# Patient Record
Sex: Female | Born: 1985 | Race: White | Hispanic: No | Marital: Single | Smoking: Never smoker
Health system: Southern US, Community
[De-identification: ages and names within clinical notes are randomized; demographics above are authoritative.]

## PROBLEM LIST (undated history)

## (undated) DIAGNOSIS — G809 Cerebral palsy, unspecified: Secondary | ICD-10-CM

## (undated) DIAGNOSIS — R569 Unspecified convulsions: Secondary | ICD-10-CM

## (undated) HISTORY — PX: VAGUS NERVE STIMULATOR INSERTION: SHX348

---

## 2013-05-23 ENCOUNTER — Emergency Department (HOSPITAL_BASED_OUTPATIENT_CLINIC_OR_DEPARTMENT_OTHER)
Admission: EM | Admit: 2013-05-23 | Discharge: 2013-05-23 | Disposition: A | Discharge status: Home / Self Care | Payer: Medicaid Other | Attending: Emergency Medicine | Admitting: Emergency Medicine

## 2013-05-23 ENCOUNTER — Encounter (HOSPITAL_BASED_OUTPATIENT_CLINIC_OR_DEPARTMENT_OTHER): Payer: Self-pay | Admitting: Emergency Medicine

## 2013-05-23 ENCOUNTER — Emergency Department (HOSPITAL_BASED_OUTPATIENT_CLINIC_OR_DEPARTMENT_OTHER): Payer: Medicaid Other

## 2013-05-23 DIAGNOSIS — IMO0002 Reserved for concepts with insufficient information to code with codable children: Secondary | ICD-10-CM | POA: Insufficient documentation

## 2013-05-23 DIAGNOSIS — Y92009 Unspecified place in unspecified non-institutional (private) residence as the place of occurrence of the external cause: Secondary | ICD-10-CM | POA: Insufficient documentation

## 2013-05-23 DIAGNOSIS — Y939 Activity, unspecified: Secondary | ICD-10-CM | POA: Insufficient documentation

## 2013-05-23 DIAGNOSIS — G40909 Epilepsy, unspecified, not intractable, without status epilepticus: Secondary | ICD-10-CM | POA: Insufficient documentation

## 2013-05-23 DIAGNOSIS — S5001XA Contusion of right elbow, initial encounter: Secondary | ICD-10-CM

## 2013-05-23 DIAGNOSIS — R569 Unspecified convulsions: Secondary | ICD-10-CM

## 2013-05-23 DIAGNOSIS — Z8669 Personal history of other diseases of the nervous system and sense organs: Secondary | ICD-10-CM | POA: Insufficient documentation

## 2013-05-23 DIAGNOSIS — S5000XA Contusion of unspecified elbow, initial encounter: Secondary | ICD-10-CM | POA: Insufficient documentation

## 2013-05-23 HISTORY — DX: Unspecified convulsions: R56.9

## 2013-05-23 HISTORY — DX: Cerebral palsy, unspecified: G80.9

## 2013-05-23 MED ORDER — IBUPROFEN 600 MG PO TABS: 600.0000 mg | ORAL_TABLET | Freq: Four times a day (QID) | ORAL | Status: AC | PRN

## 2013-05-23 NOTE — ED Provider Notes (Signed)
This chart was scribed for [name], DO by Dorothey Basemania Sutton, ED Scribe. This patient was seen in room MH04/MH04 and the patient's care was started at 8:06 PM.  CHIEF COMPLAINT: elbow injury  HPI:  HPI Comments: Makayla Lee is a 28 y.o. female who with a history of seizures (patient takes Keppra, Trileptal, and Banzel) presents to the Emergency Department from a group home complaining of an injury to the right elbow that she sustained earlier today after her caregiver reports that the patient had a seizure, causing her to hit her right arm on a wall. Patient is complaining of a constant pain with associated swelling to the area secondary to the incident. She denies hitting her head. Her caregiver reports that the patient usually has seizures daily and that her episode today was normal for her. She reports that the patient received a full physical examination about a month ago and had routine labs that were normal. She reports that she will call the patient's neurologist tomorrow. Patient is right-handed. Patient also has a history of cerebral palsy and microcephaly.  No other injury on exam. No numbness or weakness. No chest pain or shortness of breath. No abdominal pain.   ROS: See HPI Constitutional: no fever  Eyes: no drainage  ENT: no runny nose   Cardiovascular:  no chest pain  Resp: no SOB  GI: no vomiting GU: no dysuria Integumentary: no rash  Allergy: no hives  Musculoskeletal: arthralgias, joint swelling, no leg swelling  Neurological: no slurred speech ROS otherwise negative  PAST MEDICAL HISTORY/PAST SURGICAL HISTORY:  Past Medical History  Diagnosis Date  . Seizures   . CP (cerebral palsy)     MEDICATIONS:  Prior to Admission medications   Medication Sig Start Date End Date Taking? Authorizing Provider  Clobazam (ONFI PO) Take by mouth.   Yes Historical Provider, MD  LevETIRAcetam (KEPPRA PO) Take by mouth.   Yes Historical Provider, MD  OXcarbazepine (TRILEPTAL PO)  Take by mouth.   Yes Historical Provider, MD  Rufinamide (BANZEL PO) Take by mouth.   Yes Historical Provider, MD    ALLERGIES:  No Known Allergies  SOCIAL HISTORY:  History  Substance Use Topics  . Smoking status: Never Smoker   . Smokeless tobacco: Not on file  . Alcohol Use: No    FAMILY HISTORY: No family history on file.  EXAM: Triage Vitals: BP 113/70  Pulse 86  Temp(Src) 98.3 F (36.8 C) (Oral)  Resp 20  Wt 117 lb (53.071 kg)  SpO2 99%  LMP 05/02/2013  CONSTITUTIONAL: Alert and oriented and responds appropriately to questions. Well-appearing; well-nourished, smiling and talkative HEAD: Normocephalic EYES: Conjunctivae clear, PERRL ENT: normal nose; no rhinorrhea; moist mucous membranes; pharynx without lesions noted NECK: Supple, no meningismus, no LAD; no tenderness, step-offs, or deformities  CARD: RRR; S1 and S2 appreciated; no murmurs, no clicks, no rubs, no gallops RESP: Normal chest excursion without splinting or tachypnea; breath sounds clear and equal bilaterally; no wheezes, no rhonchi, no rales,  ABD/GI: Normal bowel sounds; non-distended; soft, non-tender, no rebound, no guarding BACK:  The back appears normal and is non-tender to palpation, there is no CVA tenderness; no step-offs or deformities  EXT: Normal ROM in all joints; no edema; normal capillary refill; no cyanosis; mild soft tissue swelling to the posterior aspect of the elbow with some ecchymosis, full range of motion, no swelling or tenderness to the rest of the arm  SKIN: Normal color for age and race; warm;  NEURO: Moves all extremities equally; normal strength and sensation  PSYCH: The patient's mood and manner are appropriate. Grooming and personal hygiene are appropriate.  MEDICAL DECISION MAKING:  Patient with a history of seizures, cerebral palsy, and microcephaly presents to the ED with her caregivers from the group home for an injury to the right elbow that she sustained earlier today  after a seizure. Ordered an x-ray of the right elbow that indicated some soft tissue swelling, that was negative for fracture or dislocation. Advised of symptomatic care, RICE. Will discharge patient with a prescription for ibuprofen. Return precautions given. Her caregiver reports that the seizure earlier today was typical for the patient, which she has almost daily. Patient received a physical examination and routine labs about a month ago. Her caregiver will follow up with the patient's neurologist tomorrow. They do not want labs her urine drawn today. Patient and caregivers agreeable to plan.   ED PROGRESS:  DIAGNOSTIC STUDIES: Oxygen Saturation is 99% on room air, normal by my interpretation.    COORDINATION OF CARE: 7:04 PM- Ordered an x-ray of the right elbow.   8:10 PM- Discussed that x-ray results were negative. Will discharge patient with ibuprofen. Discussed treatment plan with patient and caregiver at bedside and both verbalized agreement.  Dg Elbow Complete Right  05/23/2013   CLINICAL DATA:  Fall, pain and swelling posteriorly.  EXAM: RIGHT ELBOW - COMPLETE 3+ VIEW  COMPARISON:  None.  FINDINGS: Posterior olecranon soft tissue swelling noted focally. This can be seen with soft tissue injury/focal hematoma or olecranon bursitis. Normal right elbow alignment without fracture or effusion. Preserved joint spaces.  IMPRESSION: Olecranon soft tissue swelling.  No acute osseous finding   Electronically Signed   By: Ruel Favors M.D.   On: 05/23/2013 19:29        I personally performed the services described in this documentation, which was scribed in my presence. The recorded information has been reviewed and is accurate.    Layla Maw Terrion Gencarelli, DO 05/23/13 2021

## 2013-05-23 NOTE — Discharge Instructions (Signed)
Elbow Contusion An elbow contusion is a deep bruise of the elbow. Contusions are the result of an injury that caused bleeding under the skin. The contusion may turn blue, purple, or yellow. Minor injuries will give you a painless contusion, but more severe contusions may stay painful and swollen for a few weeks.  CAUSES  An elbow contusion comes from a direct force to that area, such as falling on the elbow. SYMPTOMS   Swelling and redness of the elbow.  Bruising of the elbow area.  Tenderness or soreness of the elbow. DIAGNOSIS  You will have a physical exam and will be asked about your history. You may need an X-ray of your elbow to look for a broken bone (fracture).  TREATMENT  A sling or splint may be needed to support your injury. Resting, elevating, and applying cold compresses to the elbow area are often the best treatments for an elbow contusion. Over-the-counter medicines may also be recommended for pain control. HOME CARE INSTRUCTIONS   Put ice on the injured area.  Put ice in a plastic bag.  Place a towel between your skin and the bag.  Leave the ice on for 15-20 minutes, 03-04 times a day.  Only take over-the-counter or prescription medicines for pain, discomfort, or fever as directed by your caregiver.  Rest your injured elbow until the pain and swelling are better.  Elevate your elbow to reduce swelling.  Apply a compression wrap as directed by your caregiver. This can help reduce swelling and motion. You may remove the wrap for sleeping, showers, and baths. If your fingers become numb, cold, or blue, take the wrap off and reapply it more loosely.  Use your elbow only as directed by your caregiver. You may be asked to do range of motion exercises. Do them as directed.  See your caregiver as directed. It is very important to keep all follow-up appointments in order to avoid any long-term problems with your elbow, including chronic pain or inability to move your elbow  normally. SEEK IMMEDIATE MEDICAL CARE IF:   You have increased redness, swelling, or pain in your elbow.  Your swelling or pain is not relieved with medicines.  You have swelling of the hand and fingers.  You are unable to move your fingers or wrist.  You begin to lose feeling in your hand or fingers.  Your fingers or hand become cold or blue. MAKE SURE YOU:   Understand these instructions.  Will watch your condition.  Will get help right away if you are not doing well or get worse. Document Released: 01/18/2006 Document Revised: 05/04/2011 Document Reviewed: 12/26/2010 [facility] Patient Information 2014 [place], [place].  Epilepsy Epilepsy is a disorder in which a person has repeated seizures over time. A seizure is a release of abnormal electrical activity in the brain. Seizures can cause a change in attention, behavior, or the ability to remain awake and alert (altered mental status). Seizures often involve uncontrollable shaking (convulsions).  Most people with epilepsy lead normal lives. However, people with epilepsy are at an increased risk of falls, accidents, and injuries. Therefore, it is important to begin treatment right away. CAUSES  Epilepsy has many possible causes. Anything that disturbs the normal pattern of brain cell activity can lead to seizures. This may include:   Head injury.  Birth trauma.  High fever as a child.  Stroke.  Bleeding into or around the brain.  Certain drugs.  Prolonged low oxygen, such as what occurs after CPR efforts.  Abnormal brain development.  Certain illnesses, such as meningitis, encephalitis (brain infection), malaria, and other infections.  An imbalance of nerve signaling chemicals (neurotransmitters).  SIGNS AND SYMPTOMS  The symptoms of a seizure can vary greatly from one person to another. Right before a seizure, you may have a warning (aura) that a seizure is about to occur. An aura may include the following  symptoms:  Fear or anxiety.  Nausea.  Feeling like the room is spinning (vertigo).  Vision changes, such as seeing flashing lights or spots. Common symptoms during a seizure include:  Abnormal sensations, such as an abnormal smell or a bitter taste in the mouth.   Sudden, general body stiffness.   Convulsions that involve rhythmic jerking of the face, arm, or leg on one or both sides.   Sudden change in consciousness.   Appearing to be awake but not responding.   Appearing to be asleep but cannot be awakened.   Grimacing, chewing, lip smacking, drooling, tongue biting, or loss of bowel or bladder control. After a seizure, you may feel sleepy for a while. DIAGNOSIS  Your health care provider will ask about your symptoms and take a medical history. Descriptions from any witnesses to your seizures will be very helpful in the diagnosis. A physical exam, including a detailed neurological exam, is necessary. Various tests may be done, such as:   An electroencephalogram (EEG). This is a painless test of your brain waves. In this test, a diagram is created of your brain waves. These diagrams can be interpreted by a specialist.  An MRI of the brain.   A CT scan of the brain.   A spinal tap (lumbar puncture, LP).  Blood tests to check for signs of infection or abnormal blood chemistry. TREATMENT  There is no cure for epilepsy, but it is generally treatable. Once epilepsy is diagnosed, it is important to begin treatment as soon as possible. For most people with epilepsy, seizures can be controlled with medicines. The following may also be used:  A pacemaker for the brain (vagus nerve stimulator) can be used for people with seizures that are not well controlled by medicine.  Surgery on the brain. For some people, epilepsy eventually goes away. HOME CARE INSTRUCTIONS   Follow your health care provider's recommendations on driving and safety in normal activities.  Get  enough rest. Lack of sleep can cause seizures.  Only take over-the-counter or prescription medicines as directed by your health care provider. Take any prescribed medicine exactly as directed.  Avoid any known triggers of your seizures.  Keep a seizure diary. Record what you recall about any seizure, especially any possible trigger.   Make sure the people you live and work with know that you are prone to seizures. They should receive instructions on how to help you. In general, a witness to a seizure should:   Cushion your head and body.   Turn you on your side.   Avoid unnecessarily restraining you.   Not place anything inside your mouth.   Call for emergency medical help if there is any question about what has occurred.   Follow up with your health care provider as directed. You may need regular blood tests to monitor the levels of your medicine.  SEEK MEDICAL CARE IF:   You develop signs of infection or other illness. This might increase the risk of a seizure.   You seem to be having more frequent seizures.   Your seizure pattern is changing.  SEEK  IMMEDIATE MEDICAL CARE IF:   You have a seizure that does not stop after a few moments.   You have a seizure that causes any difficulty in breathing.   You have a seizure that results in a very severe headache.   You have a seizure that leaves you with the inability to speak or use a part of your body.  Document Released: 02/09/2005 Document Revised: 11/30/2012 Document Reviewed: 09/21/2012 [facility] Patient Information 2014 [place], [place]. RICE: Routine Care for Injuries The routine care of many injuries includes Rest, Ice, Compression, and Elevation (RICE). HOME CARE INSTRUCTIONS  Rest is needed to allow your body to heal. Routine activities can usually be resumed when comfortable. Injured tendons and bones can take up to 6 weeks to heal. Tendons are the cord-like structures that attach muscle to bone.  Ice  following an injury helps keep the swelling down and reduces pain.  Put ice in a plastic bag.  Place a towel between your skin and the bag.  Leave the ice on for 15-20 minutes, 03-04 times a day. Do this while awake, for the first 24 to 48 hours. After that, continue as directed by your caregiver.  Compression helps keep swelling down. It also gives support and helps with discomfort. If an elastic bandage has been applied, it should be removed and reapplied every 3 to 4 hours. It should not be applied tightly, but firmly enough to keep swelling down. Watch fingers or toes for swelling, bluish discoloration, coldness, numbness, or excessive pain. If any of these problems occur, remove the bandage and reapply loosely. Contact your caregiver if these problems continue.  Elevation helps reduce swelling and decreases pain. With extremities, such as the arms, hands, legs, and feet, the injured area should be placed near or above the level of the heart, if possible. SEEK IMMEDIATE MEDICAL CARE IF:  You have persistent pain and swelling.  You develop redness, numbness, or unexpected weakness.  Your symptoms are getting worse rather than improving after several days. These symptoms may indicate that further evaluation or further X-rays are needed. Sometimes, X-rays may not show a small broken bone (fracture) until 1 week or 10 days later. Make a follow-up appointment with your caregiver. Ask when your X-ray results will be ready. Make sure you get your X-ray results. Document Released: 05/24/2000 Document Revised: 05/04/2011 Document Reviewed: 07/11/2010 [facility] Patient Information 2014 [place], [place].

## 2013-05-23 NOTE — ED Notes (Signed)
She fell x 2 today. Soft tissue swelling to her right elbow. Hx of seizures. She lives in a group home.

## 2016-02-28 ENCOUNTER — Emergency Department (HOSPITAL_BASED_OUTPATIENT_CLINIC_OR_DEPARTMENT_OTHER): Payer: Medicaid Other

## 2016-02-28 ENCOUNTER — Encounter (HOSPITAL_BASED_OUTPATIENT_CLINIC_OR_DEPARTMENT_OTHER): Payer: Self-pay | Admitting: Emergency Medicine

## 2016-02-28 ENCOUNTER — Emergency Department (HOSPITAL_BASED_OUTPATIENT_CLINIC_OR_DEPARTMENT_OTHER)
Admission: EM | Admit: 2016-02-28 | Discharge: 2016-02-28 | Disposition: A | Discharge status: Home / Self Care | Payer: Medicaid Other | Attending: Emergency Medicine | Admitting: Emergency Medicine

## 2016-02-28 DIAGNOSIS — Y929 Unspecified place or not applicable: Secondary | ICD-10-CM | POA: Diagnosis not present

## 2016-02-28 DIAGNOSIS — Y999 Unspecified external cause status: Secondary | ICD-10-CM | POA: Insufficient documentation

## 2016-02-28 DIAGNOSIS — R569 Unspecified convulsions: Secondary | ICD-10-CM

## 2016-02-28 DIAGNOSIS — Y939 Activity, unspecified: Secondary | ICD-10-CM | POA: Insufficient documentation

## 2016-02-28 DIAGNOSIS — G40909 Epilepsy, unspecified, not intractable, without status epilepticus: Secondary | ICD-10-CM | POA: Insufficient documentation

## 2016-02-28 DIAGNOSIS — Z23 Encounter for immunization: Secondary | ICD-10-CM | POA: Diagnosis not present

## 2016-02-28 DIAGNOSIS — S025XXA Fracture of tooth (traumatic), initial encounter for closed fracture: Secondary | ICD-10-CM | POA: Diagnosis not present

## 2016-02-28 DIAGNOSIS — S51011A Laceration without foreign body of right elbow, initial encounter: Secondary | ICD-10-CM | POA: Diagnosis not present

## 2016-02-28 DIAGNOSIS — S0993XA Unspecified injury of face, initial encounter: Secondary | ICD-10-CM | POA: Diagnosis present

## 2016-02-28 DIAGNOSIS — W1839XA Other fall on same level, initial encounter: Secondary | ICD-10-CM | POA: Insufficient documentation

## 2016-02-28 DIAGNOSIS — W19XXXA Unspecified fall, initial encounter: Secondary | ICD-10-CM

## 2016-02-28 MED ORDER — TETANUS-DIPHTH-ACELL PERTUSSIS 5-2.5-18.5 LF-MCG/0.5 IM SUSP
0.5000 mL | Freq: Once | INTRAMUSCULAR | Status: AC
  Administered 2016-02-28: 0.5 mL via INTRAMUSCULAR
  Filled 2016-02-28: qty 0.5

## 2016-02-28 MED ORDER — LIDOCAINE-EPINEPHRINE (PF) 2 %-1:200000 IJ SOLN: 10.0000 mL | Freq: Once | INTRAMUSCULAR | Status: DC

## 2016-02-28 MED ORDER — LIDOCAINE HCL 2 % IJ SOLN
INTRAMUSCULAR | Status: AC
  Filled 2016-02-28: qty 20

## 2016-02-28 NOTE — ED Provider Notes (Signed)
[facility]   CSN: [id number] Arrival date & time: 02/28/16  1705     History   Chief Complaint Chief Complaint  Patient presents with  . Fall    HPI Makayla Lee is a 31 y.o. female.  HPI Pt has a history of CP and frequent seizures.  She had another seizure today which was not unusual for her but she fell forward injuring her face and her right elbow.  Pt fractured a tooth and sustained a lip laceration so she went to her dentist's office.  She had her teeth evaluated and her lip laceration sutured,(mucosal sutures and possibly internal sutures)   She also injured her right elbow so she came to get that evaluated.  Family states the seizure frequency is not unusual unfortunately.  Pt has no complaints now.  She states her elbow hurts some but is not severe.  No headache, vomiting, fevers or chills Past Medical History:  Diagnosis Date  . CP (cerebral palsy) (HCC)   . Seizures (HCC)     There are no active problems to display for this patient.   Past Surgical History:  Procedure Laterality Date  . VAGUS NERVE STIMULATOR INSERTION      OB History    No data available       Home Medications    Prior to Admission medications   Medication Sig Start Date End Date Taking? Authorizing Provider  Clobazam (ONFI PO) Take by mouth.    Historical Provider, MD  ibuprofen (ADVIL,MOTRIN) 600 MG tablet Take 1 tablet (600 mg total) by mouth every 6 (six) hours as needed. 05/23/13   Kristen N Ward, DO  LevETIRAcetam (KEPPRA PO) Take by mouth.    Historical Provider, MD  OXcarbazepine (TRILEPTAL PO) Take by mouth.    Historical Provider, MD  Rufinamide (BANZEL PO) Take by mouth.    Historical Provider, MD    Family History History reviewed. No pertinent family history.  Social History Social History  Substance Use Topics  . Smoking status: Never Smoker  . Smokeless tobacco: Never Used  . Alcohol use No     Allergies   Patient has no known  allergies.   Review of Systems Review of Systems  All other systems reviewed and are negative.    Physical Exam Updated Vital Signs BP 122/83 (BP Location: Left Arm)   Pulse 112   Temp 98.9 F (37.2 C) (Oral)   Resp 18   Ht 5\' 1"  (1.549 m)   Wt 59.4 kg   SpO2 100%   BMI 24.75 kg/m   Physical Exam  Constitutional: She appears well-developed and well-nourished. No distress.  HENT:  Head: Normocephalic and atraumatic.  Right Ear: External ear normal.  Left Ear: External ear normal.  Fractured tooth central incisor, sutured mucosal laceration of the lower lip, laceration externally of the lower lip at  the mental crease that is well approximated  Eyes: Conjunctivae are normal. Right eye exhibits no discharge. Left eye exhibits no discharge. No scleral icterus.  Neck: Neck supple. No tracheal deviation present.  Cardiovascular: Normal rate, regular rhythm and intact distal pulses.   Pulmonary/Chest: Effort normal and breath sounds normal. No stridor. No respiratory distress. She has no wheezes. She has no rales.  Abdominal: Soft. Bowel sounds are normal. She exhibits no distension. There is no tenderness. There is no rebound and no guarding.  Musculoskeletal: She exhibits no edema.       Right elbow: She exhibits laceration. Tenderness found.  Laceration olecranon process right, sub q tissue visualized, no bone or tendon visualized  Neurological: She is alert. She has normal strength. No cranial nerve deficit (no facial droop, extraocular movements intact, no slurred speech) or sensory deficit. She exhibits normal muscle tone. She displays no seizure activity. Coordination normal.  Skin: Skin is warm and dry. No rash noted.  Psychiatric: She has a normal mood and affect.  Nursing note and vitals reviewed.    ED Treatments / Results  Labs (all labs ordered are listed, but only abnormal results are displayed) Labs Reviewed - No data to display  Radiology Dg Elbow Complete  Right  Result Date: 02/28/2016 CLINICAL DATA:  Fall.  Elbow injury. EXAM: RIGHT ELBOW - COMPLETE 3+ VIEW COMPARISON:  05/23/2013 FINDINGS: There is no evidence of fracture, dislocation, or joint effusion. There is no evidence of arthropathy or other focal bone abnormality. Soft tissues are unremarkable. IMPRESSION: Negative. Electronically Signed   By: Marlan Palau M.D.   On: 02/28/2016 18:31    Procedures Procedures (including critical care time)  Medications Ordered in ED Medications  lidocaine-EPINEPHrine (XYLOCAINE W/EPI) 2 %-1:200000 (PF) injection 10 mL (not administered)  lidocaine (XYLOCAINE) 2 % (with pres) injection (not administered)  Tdap (BOOSTRIX) injection 0.5 mL (0.5 mLs Intramuscular Given 02/28/16 1839)     Initial Impression / Assessment and Plan / ED Course  I have reviewed the triage vital signs and the nursing notes.  Pertinent labs & imaging results that were available during my care of the patient were reviewed by me and considered in my medical decision making (see chart for details).  Clinical Course as of Feb 28 1928  Caleen Essex Feb 28, 2016  1610 Facial laceration appears to be adequately sutured by her dentist.  No external sutures on the skin but I suspect internal sutures with mucosal sutures.  It is well approximated, I do not think additional facial sutures are necessary.  [JK]    Clinical Course User Index [JK] Linwood Dibbles, MD   No fx of elbow.  Lip laceration and dental injury already repaired.   Seizure pattern is typical for her per family. Elbow laceration repaired by PA Geiple.  At this time there does not appear to be any evidence of an acute emergency medical condition and the patient appears stable for discharge with appropriate outpatient follow up.   Final Clinical Impressions(s) / ED Diagnoses   Final diagnoses:  Laceration of right elbow, initial encounter  Seizure [facility])  Fall, initial encounter    New Prescriptions New Prescriptions   No  medications on file     Linwood Dibbles, MD 02/29/16 0040

## 2016-02-28 NOTE — ED Notes (Signed)
Family at bedside. 

## 2016-02-28 NOTE — ED Triage Notes (Signed)
Patient's mom reports that [place] the [employer] [place] and had a seizure. Per the caregiver at the group home the patient fell forward and hit her face and mouth. Tooth chipped and chin laceration and elbow.

## 2016-02-28 NOTE — ED Notes (Signed)
ED Provider at bedside. 

## 2019-03-03 ENCOUNTER — Other Ambulatory Visit: Payer: Self-pay

## 2019-03-03 ENCOUNTER — Emergency Department (HOSPITAL_BASED_OUTPATIENT_CLINIC_OR_DEPARTMENT_OTHER): Payer: Medicaid Other

## 2019-03-03 ENCOUNTER — Encounter (HOSPITAL_BASED_OUTPATIENT_CLINIC_OR_DEPARTMENT_OTHER): Payer: Self-pay | Admitting: *Deleted

## 2019-03-03 ENCOUNTER — Emergency Department (HOSPITAL_BASED_OUTPATIENT_CLINIC_OR_DEPARTMENT_OTHER)
Admission: EM | Admit: 2019-03-03 | Discharge: 2019-03-04 | Disposition: A | Discharge status: Home / Self Care | Payer: Medicaid Other | Attending: Emergency Medicine | Admitting: Emergency Medicine

## 2019-03-03 DIAGNOSIS — Y999 Unspecified external cause status: Secondary | ICD-10-CM | POA: Insufficient documentation

## 2019-03-03 DIAGNOSIS — W19XXXA Unspecified fall, initial encounter: Secondary | ICD-10-CM | POA: Insufficient documentation

## 2019-03-03 DIAGNOSIS — S0182XA Laceration with foreign body of other part of head, initial encounter: Secondary | ICD-10-CM | POA: Diagnosis not present

## 2019-03-03 DIAGNOSIS — R569 Unspecified convulsions: Secondary | ICD-10-CM | POA: Diagnosis present

## 2019-03-03 DIAGNOSIS — S0181XA Laceration without foreign body of other part of head, initial encounter: Secondary | ICD-10-CM

## 2019-03-03 DIAGNOSIS — Y939 Activity, unspecified: Secondary | ICD-10-CM | POA: Diagnosis not present

## 2019-03-03 DIAGNOSIS — Y929 Unspecified place or not applicable: Secondary | ICD-10-CM | POA: Insufficient documentation

## 2019-03-03 DIAGNOSIS — G809 Cerebral palsy, unspecified: Secondary | ICD-10-CM | POA: Diagnosis not present

## 2019-03-03 MED ORDER — LIDOCAINE-EPINEPHRINE-TETRACAINE (LET) TOPICAL GEL
3.0000 mL | Freq: Once | TOPICAL | Status: AC
  Administered 2019-03-03: 3 mL via TOPICAL
  Filled 2019-03-03: qty 3

## 2019-03-03 NOTE — ED Triage Notes (Signed)
She lost her balance and fell. Through and through laceration below her lower lip. Bleeding controlled.

## 2019-03-03 NOTE — ED Provider Notes (Signed)
Emergency Department Provider Note   I have reviewed the triage vital signs and the nursing notes.   HISTORY  Chief Complaint Fall and Laceration   HPI Makayla Lee is a 34 y.o. female with a history of cerebral palsy and seizure disorder who had a seizure today and fell and suffered a laceration to her lower lip.  She also has some bruising and swelling around her right eye.  She is back to baseline now per her family member.  No injuries elsewhere.   Up-to-date on tetanus.  No other associated or modifying symptoms.    Past Medical History:  Diagnosis Date  . CP (cerebral palsy) ([place])   . Seizures ([place])     There are no problems to display for this patient.   Past Surgical History:  Procedure Laterality Date  . VAGUS NERVE STIMULATOR INSERTION      Current Outpatient Rx  . Order #: [id number] Class: Historical Med  . Order #: [id number] Class: Print  . Order #: [id number] Class: Historical Med  . Order #: [id number] Class: Historical Med  . Order #: [id number] Class: Historical Med    Allergies Patient has no known allergies.  No family history on file.  Social History Social History   Tobacco Use  . Smoking status: Never Smoker  . Smokeless tobacco: Never Used  Substance Use Topics  . Alcohol use: No  . Drug use: No    Review of Systems  All other systems negative except as documented in the HPI. All pertinent positives and negatives as reviewed in the HPI. ____________________________________________   PHYSICAL EXAM:  VITAL SIGNS: ED Triage Vitals  Enc Vitals Group     BP 03/03/19 1843 131/87     Pulse Rate 03/03/19 1843 97     Resp 03/03/19 1843 20     Temp 03/03/19 1843 99.9 F (37.7 C)     Temp Source 03/03/19 1843 Oral     SpO2 03/03/19 1843 99 %     Weight --      Height 03/03/19 1841 5' (1.524 m)     Head Circumference --      Peak Flow --      Pain Score 03/03/19 1841 3     Pain Loc --      Pain Edu? --      Excl. in [place]? --      Constitutional: Alert and oriented. Well appearing and in no acute distress. Eyes: Conjunctivae are normal. PERRL. EOMI. Head: Hematoma to the lateral side of her right eye.. Nose: No congestion/rhinnorhea. Mouth/Throat: Mucous membranes are moist.  Oropharynx non-erythematous.  1 cm laceration buccal side of lower lip.  Well approximated.  Hemostatic.  No loose teeth. Neck: No stridor.  No meningeal signs.   Cardiovascular: Normal rate, regular rhythm. Good peripheral circulation. Grossly normal heart sounds.   Respiratory: Normal respiratory effort.  No retractions. Lungs CTAB. Gastrointestinal: Soft and nontender. No distention.  Musculoskeletal: No lower extremity tenderness nor edema. No gross deformities of extremities. Neurologic:  Normal speech and language. No gross focal neurologic deficits are appreciated.  Skin:  Skin is warm, dry and intact. No rash noted.  1.5 cm laceration to her lower chin.  Does appear to communicate with vegetables mouth.   ____________________________________________   LABS (all labs ordered are listed, but only abnormal results are displayed)  Labs Reviewed - No data to display ____________________________________________  RADIOLOGY  CT Maxillofacial Wo Contrast  Result Date: 03/04/2019 CLINICAL DATA:  Facial trauma, fall laceration over  the lower lip EXAM: CT MAXILLOFACIAL WITHOUT CONTRAST TECHNIQUE: Multidetector CT imaging of the maxillofacial structures was performed. Multiplanar CT image reconstructions were also generated. COMPARISON:  None. FINDINGS: Osseous: No acute fracture or other significant osseous abnormality.The nasal bone, mandibles, zygomatic arches and pterygoid plates are intact. Orbits: No fracture identified. Unremarkable appearance of globes and orbits. Sinuses: The visualized paranasal sinuses and mastoid air cells are unremarkable. Soft tissues: Soft tissue laceration with small hematoma and soft tissue swelling seen over  the left anterior lower mandible. Limited intracranial: No acute findings. IMPRESSION: No acute osseous injury. Soft tissue laceration with small hematoma and swelling over the left lower anterior mandible. Electronically Signed   By: Jonna Clark M.D.   On: 03/04/2019 00:21    ____________________________________________   PROCEDURES  Procedure(s) performed:   Marland KitchenMarland KitchenLaceration Repair  Date/Time: 03/04/2019 12:42 AM Performed by: Marily Memos, MD Authorized by: Marily Memos, MD   Consent:    Consent obtained:  Verbal   Consent given by:  Patient   Risks discussed:  Infection, need for additional repair, nerve damage, poor wound healing, poor cosmetic result, pain, retained foreign body, tendon damage and vascular damage   Alternatives discussed:  No treatment, delayed treatment and observation Universal protocol:    Procedure explained and questions answered to patient or proxy's satisfaction: yes     Patient identity confirmed:  Verbally with patient and provided demographic data Anesthesia (see MAR for exact dosages):    Anesthesia method:  Local infiltration and topical application   Topical anesthetic:  LET Laceration details:    Location:  Face   Face location:  Chin   Length (cm):  1.5 (stellate)   Depth (mm):  5 Repair type:    Repair type:  Intermediate Pre-procedure details:    Preparation:  Patient was prepped and draped in usual sterile fashion and imaging obtained to evaluate for foreign bodies Exploration:    Wound exploration: wound explored through full range of motion and entire depth of wound probed and visualized   Treatment:    Area cleansed with:  Saline   Amount of cleaning:  Extensive   Irrigation solution:  Sterile water   Irrigation volume:  150   Irrigation method:  Syringe   Visualized foreign bodies/material removed: yes (7 pieces of paint/painted wood retrieved)   Mucous membrane repair:    Suture size:  5-0   Suture material:  Plain gut    Suture technique:  Simple interrupted   Number of sutures:  1 Skin repair:    Repair method:  Sutures   Suture size:  5-0   Suture material:  Fast-absorbing gut   Suture technique:  Simple interrupted   Number of sutures:  3 Approximation:    Approximation:  Close Post-procedure details:    Dressing:  Open (no dressing)   Patient tolerance of procedure:  Tolerated well, no immediate complications     ____________________________________________   INITIAL IMPRESSION / ASSESSMENT AND PLAN / ED COURSE  Laceration will need repair after CT scan for facial trauma.  Wound repaired as above.  After repairing the skin side of the wound it was apparent that the inside did not pull together as expected.  One suture was placed to approximate the wound as well.  Retrieved multiple foreign bodies that appear to be paint and painted wood from the wound.  Irrigated thoroughly afterwards without any remaining foreign bodies to be seen.  I discussed with her mother that this increases the chances of infection  and went over the signs of infection multiple times and to return to the emergency department if present.  She also return if sutures have not fallen out in 7 days.   Pertinent labs & imaging results that were available during my care of the patient were reviewed by me and considered in my medical decision making (see chart for details).  A medical screening exam was performed and I feel the patient has had an appropriate workup for their chief complaint at this time and likelihood of emergent condition existing is low. They have been counseled on decision, discharge, follow up and which symptoms necessitate immediate return to the emergency department. They or their family verbally stated understanding and agreement with plan and discharged in stable condition.   ____________________________________________  FINAL CLINICAL IMPRESSION(S) / ED DIAGNOSES  Final diagnoses:  Facial laceration,  initial encounter  Seizure ([facility])     MEDICATIONS GIVEN DURING THIS VISIT:  Medications  lidocaine-EPINEPHrine-tetracaine (LET) topical gel (3 mLs Topical Given 03/03/19 2335)     NEW OUTPATIENT MEDICATIONS STARTED DURING THIS VISIT:  Discharge Medication List as of 03/04/2019 12:42 AM      Note:  This note was prepared with assistance of Dragon voice recognition software. Occasional wrong-word or sound-a-like substitutions may have occurred due to the inherent limitations of voice recognition software.   Georgeanna Radziewicz, Barbara Cower, MD 03/04/19 [phone]

## 2019-03-04 NOTE — Discharge Instructions (Signed)
I received multiple pieces of pain and wood out of your wound.  After multiple rinsing I did not notice any more foreign bodies.  However there is a possibility of one still being in there.  This increases the importance of keeping an eye on the wound.  He expect a little bit of redness, soreness and worsening swelling over the next 24 hours.  If you notice foul-smelling drainage, worsening swelling\redness after 48 hours, fevers or other signs of infection please return to the emergency room immediately.  I have placed fast-absorbing sutures into your wound.  The one on the inside of your mouth should dissolve within the next 3 to 5 days and the ones on the face in 5 to 7 days.  If the ones on your face have not resolved by 7 days please return here for removal.  Keep the wound clean and dry per instructions.

## 2020-09-30 IMAGING — CT CT MAXILLOFACIAL W/O CM
3 series · 16 of 47 positions shown, 19 images · non-contrast
Comparison: None.

CLINICAL DATA: Facial trauma, fall laceration over the lower lip

EXAM:
CT MAXILLOFACIAL WITHOUT CONTRAST
TECHNIQUE: Multidetector CT imaging of the maxillofacial structures was
performed. Multiplanar CT image reconstructions were also generated.

[Series 3: max soft · axial · 0.31mm/px · z∈[-166,-42]mm · 10 of 74 slices shown, 13 images]
[im 6/74  brain]
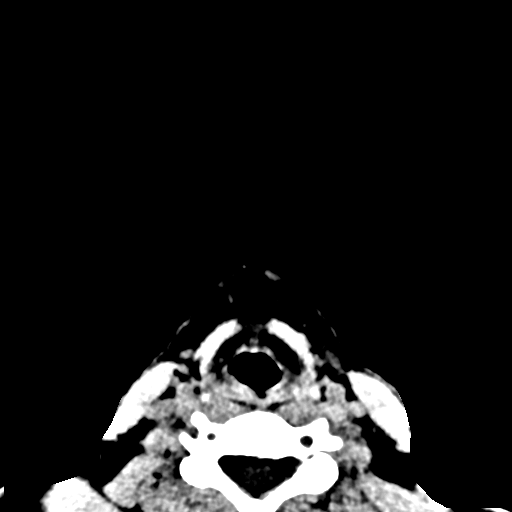
[im 6/74  bone]
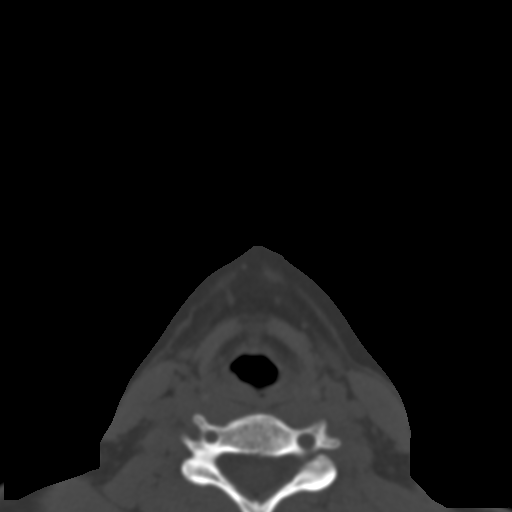
[im 13/74  bone]
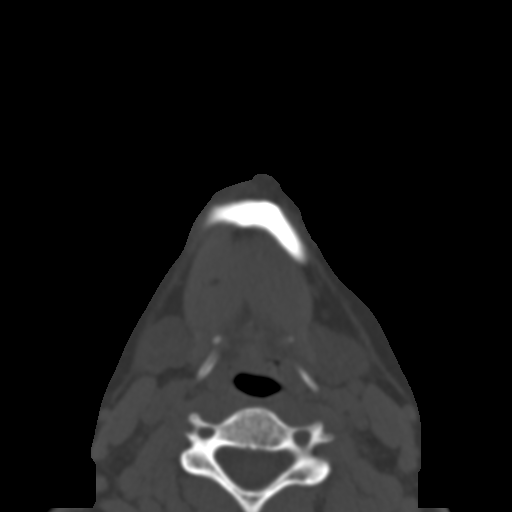
[im 21/74  bone]
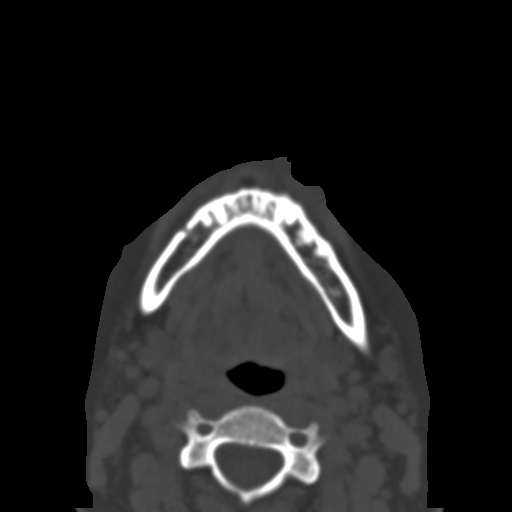
[im 26/74  bone]
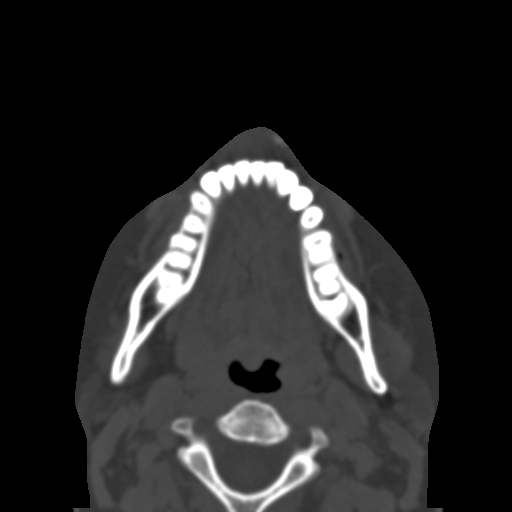
[im 33/74  brain]
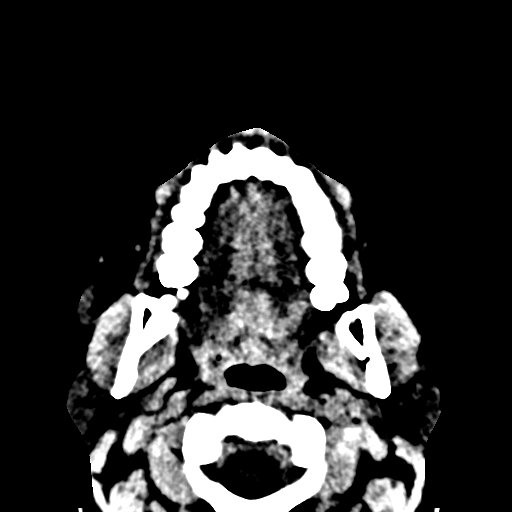
[im 33/74  bone]
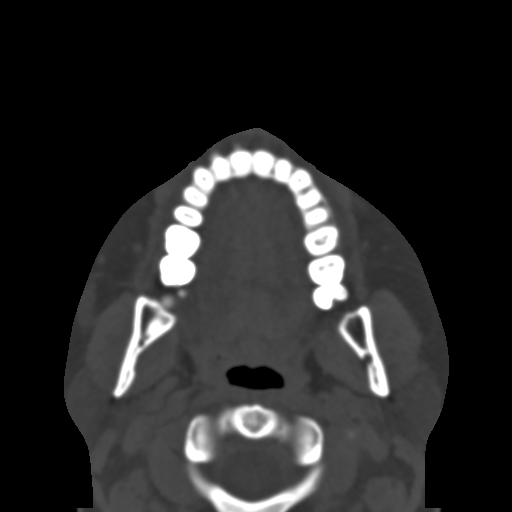
[im 41/74  bone]
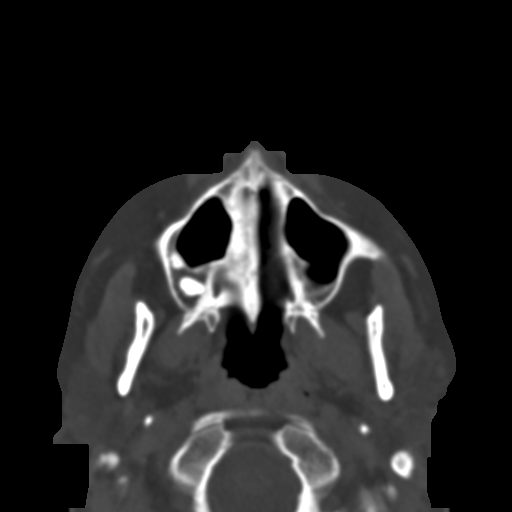
[im 48/74  bone]
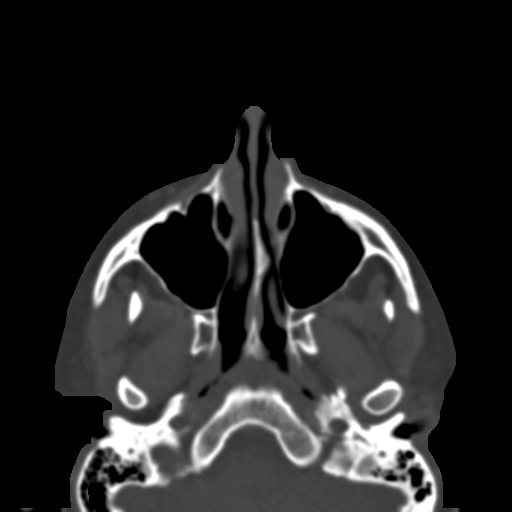
[im 56/74  bone]
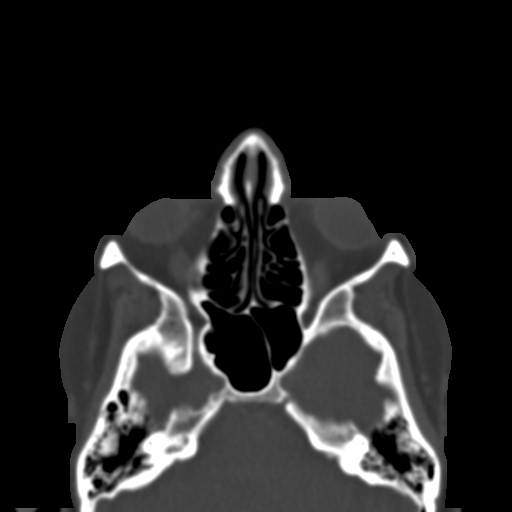
[im 61/74  brain]
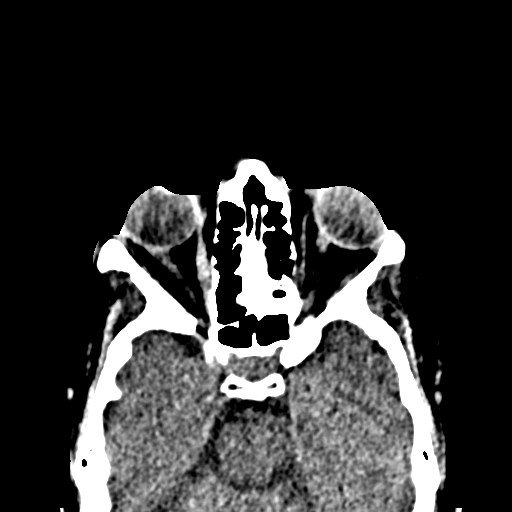
[im 61/74  bone]
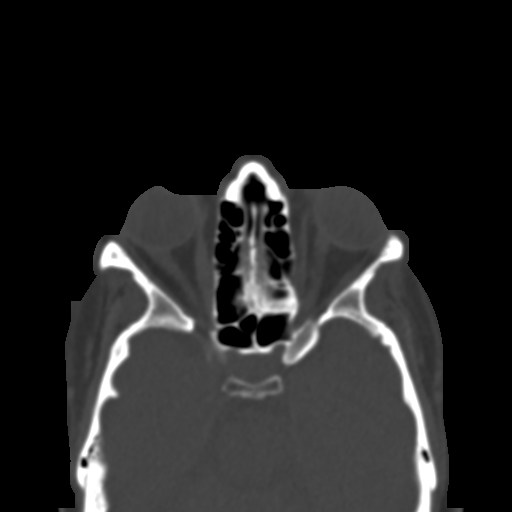
[im 68/74  bone]
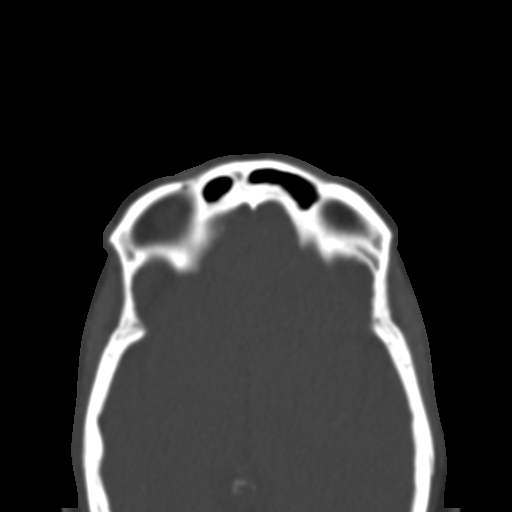

[Series 5: coronal soft · coronal · 0.31mm/px · 3 of 72 slices shown]
[im 24/72  bone]
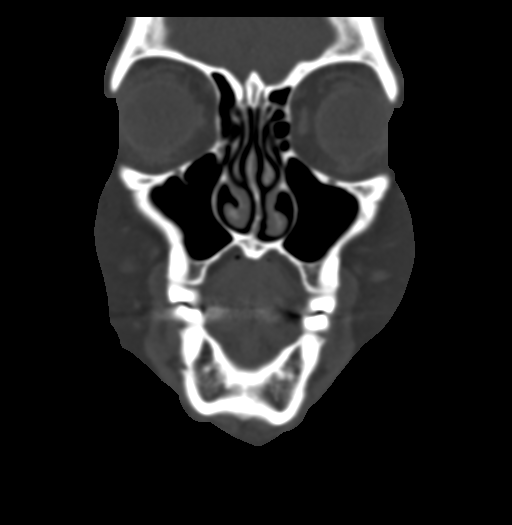
[im 32/72  bone]
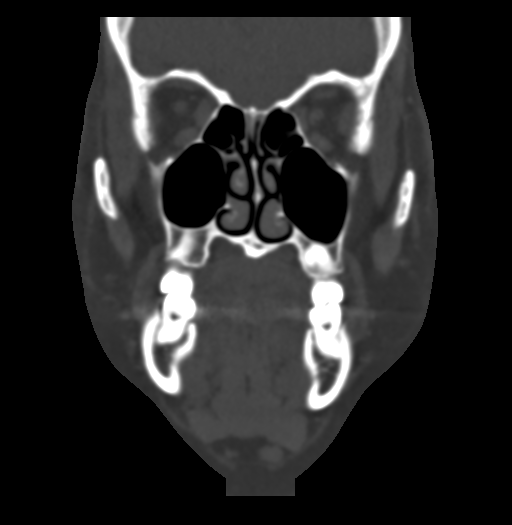
[im 40/72  bone]
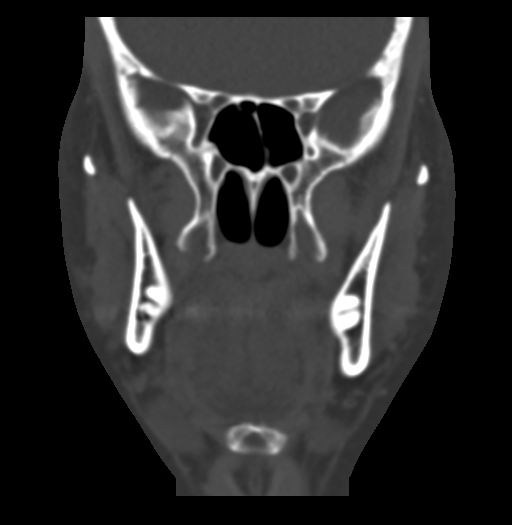

[Series 7: sagittal soft · sagittal · 0.31mm/px · 3 of 74 slices shown]
[im 25/74  bone]
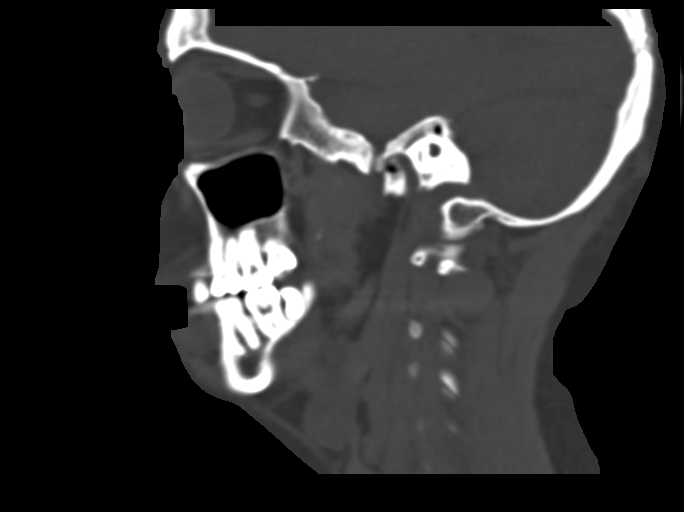
[im 37/74  bone]
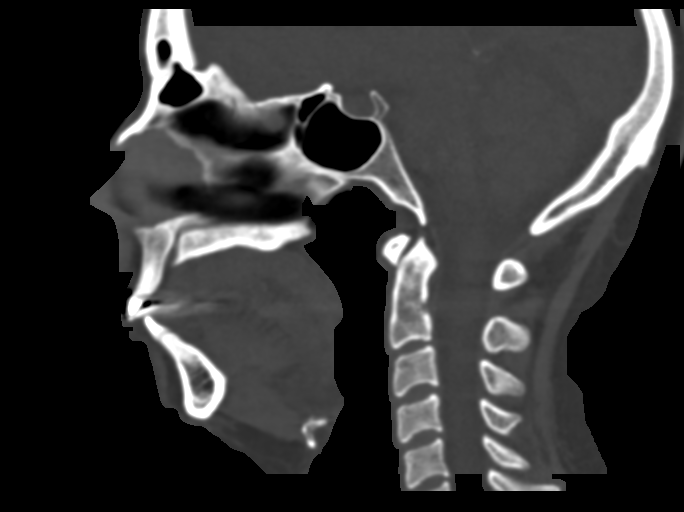
[im 49/74  bone]
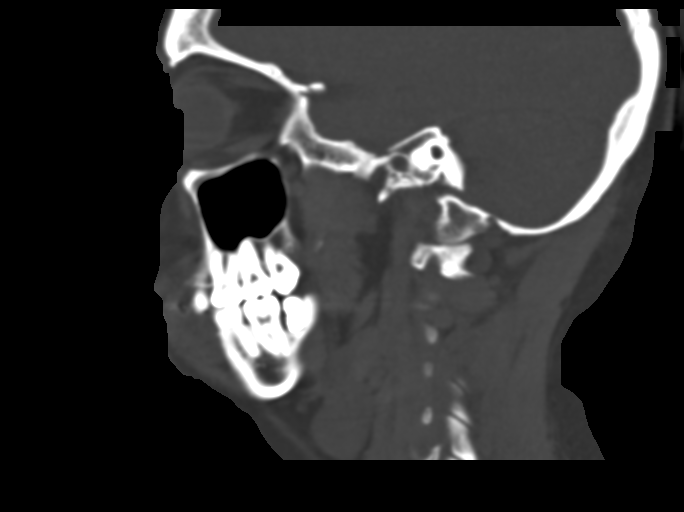

[16 of 47 positions shown; findings below may reference images not displayed]

FINDINGS: Osseous: No acute fracture or other significant osseous
abnormality.The nasal bone, mandibles, zygomatic arches and
pterygoid plates are intact.

Orbits: No fracture identified. Unremarkable appearance of globes
and orbits.

Sinuses: The visualized paranasal sinuses and mastoid air cells are
unremarkable.

Soft tissues: Soft tissue laceration with small hematoma and soft
tissue swelling seen over the left anterior lower mandible.

Limited intracranial: No acute findings.
IMPRESSION: No acute osseous injury.

Soft tissue laceration with small hematoma and swelling over the
left lower anterior mandible.
